# Patient Record
Sex: Male | Born: 2005 | Race: White | Hispanic: No | Marital: Single | Smoking: Never smoker
Health system: Southern US, Community
[De-identification: ages and names within clinical notes are randomized; demographics above are authoritative.]

## PROBLEM LIST (undated history)

## (undated) DIAGNOSIS — R569 Unspecified convulsions: Secondary | ICD-10-CM

## (undated) HISTORY — DX: Unspecified convulsions: R56.9

## (undated) HISTORY — PX: CIRCUMCISION: SHX1350

---
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
## 2023-01-20 ENCOUNTER — Other Ambulatory Visit (HOSPITAL_BASED_OUTPATIENT_CLINIC_OR_DEPARTMENT_OTHER): Payer: Self-pay | Admitting: Pulmonary Disease

## 2023-04-10 DIAGNOSIS — M6281 Muscle weakness (generalized): Secondary | ICD-10-CM | POA: Diagnosis not present

## 2023-04-10 DIAGNOSIS — M25612 Stiffness of left shoulder, not elsewhere classified: Secondary | ICD-10-CM | POA: Diagnosis not present

## 2023-04-10 DIAGNOSIS — M25512 Pain in left shoulder: Secondary | ICD-10-CM | POA: Diagnosis not present

## 2023-04-13 DIAGNOSIS — G40309 Generalized idiopathic epilepsy and epileptic syndromes, not intractable, without status epilepticus: Secondary | ICD-10-CM | POA: Diagnosis not present

## 2023-04-16 DIAGNOSIS — M6281 Muscle weakness (generalized): Secondary | ICD-10-CM | POA: Diagnosis not present

## 2023-04-16 DIAGNOSIS — M25512 Pain in left shoulder: Secondary | ICD-10-CM | POA: Diagnosis not present

## 2023-04-16 DIAGNOSIS — M25612 Stiffness of left shoulder, not elsewhere classified: Secondary | ICD-10-CM | POA: Diagnosis not present

## 2023-04-16 DIAGNOSIS — R9401 Abnormal electroencephalogram [EEG]: Secondary | ICD-10-CM | POA: Diagnosis not present

## 2023-04-16 DIAGNOSIS — G40309 Generalized idiopathic epilepsy and epileptic syndromes, not intractable, without status epilepticus: Secondary | ICD-10-CM | POA: Diagnosis not present

## 2023-04-22 DIAGNOSIS — M25512 Pain in left shoulder: Secondary | ICD-10-CM | POA: Diagnosis not present

## 2023-04-22 DIAGNOSIS — Z4789 Encounter for other orthopedic aftercare: Secondary | ICD-10-CM | POA: Diagnosis not present

## 2023-04-25 DIAGNOSIS — M25512 Pain in left shoulder: Secondary | ICD-10-CM | POA: Diagnosis not present

## 2023-04-25 DIAGNOSIS — Z4789 Encounter for other orthopedic aftercare: Secondary | ICD-10-CM | POA: Diagnosis not present

## 2023-04-29 DIAGNOSIS — Z4789 Encounter for other orthopedic aftercare: Secondary | ICD-10-CM | POA: Diagnosis not present

## 2023-04-29 DIAGNOSIS — M25512 Pain in left shoulder: Secondary | ICD-10-CM | POA: Diagnosis not present

## 2023-05-01 DIAGNOSIS — Z4789 Encounter for other orthopedic aftercare: Secondary | ICD-10-CM | POA: Diagnosis not present

## 2023-05-01 DIAGNOSIS — M25512 Pain in left shoulder: Secondary | ICD-10-CM | POA: Diagnosis not present

## 2023-05-09 DIAGNOSIS — R9401 Abnormal electroencephalogram [EEG]: Secondary | ICD-10-CM | POA: Diagnosis not present

## 2023-05-09 DIAGNOSIS — F909 Attention-deficit hyperactivity disorder, unspecified type: Secondary | ICD-10-CM | POA: Diagnosis not present

## 2023-05-09 DIAGNOSIS — G40409 Other generalized epilepsy and epileptic syndromes, not intractable, without status epilepticus: Secondary | ICD-10-CM | POA: Diagnosis not present

## 2023-05-09 DIAGNOSIS — G40309 Generalized idiopathic epilepsy and epileptic syndromes, not intractable, without status epilepticus: Secondary | ICD-10-CM | POA: Diagnosis not present

## 2023-05-11 DIAGNOSIS — G40309 Generalized idiopathic epilepsy and epileptic syndromes, not intractable, without status epilepticus: Secondary | ICD-10-CM | POA: Diagnosis not present

## 2023-05-11 DIAGNOSIS — R9401 Abnormal electroencephalogram [EEG]: Secondary | ICD-10-CM | POA: Diagnosis not present

## 2023-05-12 DIAGNOSIS — I498 Other specified cardiac arrhythmias: Secondary | ICD-10-CM | POA: Diagnosis not present

## 2023-05-13 DIAGNOSIS — Z4789 Encounter for other orthopedic aftercare: Secondary | ICD-10-CM | POA: Diagnosis not present

## 2023-05-13 DIAGNOSIS — Z9889 Other specified postprocedural states: Secondary | ICD-10-CM | POA: Diagnosis not present

## 2023-05-13 DIAGNOSIS — M25512 Pain in left shoulder: Secondary | ICD-10-CM | POA: Diagnosis not present

## 2023-05-15 DIAGNOSIS — Z9889 Other specified postprocedural states: Secondary | ICD-10-CM | POA: Diagnosis not present

## 2023-05-15 DIAGNOSIS — Z4789 Encounter for other orthopedic aftercare: Secondary | ICD-10-CM | POA: Diagnosis not present

## 2023-05-15 DIAGNOSIS — M25512 Pain in left shoulder: Secondary | ICD-10-CM | POA: Diagnosis not present

## 2023-05-22 DIAGNOSIS — M25512 Pain in left shoulder: Secondary | ICD-10-CM | POA: Diagnosis not present

## 2023-05-27 DIAGNOSIS — Z9889 Other specified postprocedural states: Secondary | ICD-10-CM | POA: Diagnosis not present

## 2023-05-27 DIAGNOSIS — Z4789 Encounter for other orthopedic aftercare: Secondary | ICD-10-CM | POA: Diagnosis not present

## 2023-05-27 DIAGNOSIS — M25512 Pain in left shoulder: Secondary | ICD-10-CM | POA: Diagnosis not present

## 2023-06-05 DIAGNOSIS — M25512 Pain in left shoulder: Secondary | ICD-10-CM | POA: Diagnosis not present

## 2023-06-05 DIAGNOSIS — Z9889 Other specified postprocedural states: Secondary | ICD-10-CM | POA: Diagnosis not present

## 2023-06-05 DIAGNOSIS — Z4789 Encounter for other orthopedic aftercare: Secondary | ICD-10-CM | POA: Diagnosis not present

## 2023-06-10 DIAGNOSIS — M25512 Pain in left shoulder: Secondary | ICD-10-CM | POA: Diagnosis not present

## 2023-06-10 DIAGNOSIS — Z4789 Encounter for other orthopedic aftercare: Secondary | ICD-10-CM | POA: Diagnosis not present

## 2023-06-10 DIAGNOSIS — Z9889 Other specified postprocedural states: Secondary | ICD-10-CM | POA: Diagnosis not present

## 2023-06-19 DIAGNOSIS — Z9889 Other specified postprocedural states: Secondary | ICD-10-CM | POA: Diagnosis not present

## 2023-06-19 DIAGNOSIS — Z4789 Encounter for other orthopedic aftercare: Secondary | ICD-10-CM | POA: Diagnosis not present

## 2023-06-19 DIAGNOSIS — M25512 Pain in left shoulder: Secondary | ICD-10-CM | POA: Diagnosis not present

## 2023-06-24 DIAGNOSIS — M25512 Pain in left shoulder: Secondary | ICD-10-CM | POA: Diagnosis not present

## 2023-06-24 DIAGNOSIS — Z9889 Other specified postprocedural states: Secondary | ICD-10-CM | POA: Diagnosis not present

## 2023-06-24 DIAGNOSIS — Z4789 Encounter for other orthopedic aftercare: Secondary | ICD-10-CM | POA: Diagnosis not present

## 2023-06-26 DIAGNOSIS — Z9889 Other specified postprocedural states: Secondary | ICD-10-CM | POA: Diagnosis not present

## 2023-06-26 DIAGNOSIS — M25512 Pain in left shoulder: Secondary | ICD-10-CM | POA: Diagnosis not present

## 2023-06-26 DIAGNOSIS — Z4789 Encounter for other orthopedic aftercare: Secondary | ICD-10-CM | POA: Diagnosis not present

## 2023-07-03 DIAGNOSIS — H93291 Other abnormal auditory perceptions, right ear: Secondary | ICD-10-CM | POA: Diagnosis not present

## 2023-07-03 DIAGNOSIS — H6981 Other specified disorders of Eustachian tube, right ear: Secondary | ICD-10-CM | POA: Diagnosis not present

## 2023-07-08 DIAGNOSIS — Z9889 Other specified postprocedural states: Secondary | ICD-10-CM | POA: Diagnosis not present

## 2023-07-08 DIAGNOSIS — M25512 Pain in left shoulder: Secondary | ICD-10-CM | POA: Diagnosis not present

## 2023-07-08 DIAGNOSIS — Z4789 Encounter for other orthopedic aftercare: Secondary | ICD-10-CM | POA: Diagnosis not present

## 2023-07-10 DIAGNOSIS — M25512 Pain in left shoulder: Secondary | ICD-10-CM | POA: Diagnosis not present

## 2023-07-10 DIAGNOSIS — Z9889 Other specified postprocedural states: Secondary | ICD-10-CM | POA: Diagnosis not present

## 2023-07-10 DIAGNOSIS — Z4789 Encounter for other orthopedic aftercare: Secondary | ICD-10-CM | POA: Diagnosis not present

## 2023-07-15 DIAGNOSIS — M25512 Pain in left shoulder: Secondary | ICD-10-CM | POA: Diagnosis not present

## 2023-07-15 DIAGNOSIS — Z4789 Encounter for other orthopedic aftercare: Secondary | ICD-10-CM | POA: Diagnosis not present

## 2023-07-15 DIAGNOSIS — Z9889 Other specified postprocedural states: Secondary | ICD-10-CM | POA: Diagnosis not present

## 2023-07-24 DIAGNOSIS — Z9889 Other specified postprocedural states: Secondary | ICD-10-CM | POA: Diagnosis not present

## 2023-07-24 DIAGNOSIS — Z4789 Encounter for other orthopedic aftercare: Secondary | ICD-10-CM | POA: Diagnosis not present

## 2023-07-24 DIAGNOSIS — M25512 Pain in left shoulder: Secondary | ICD-10-CM | POA: Diagnosis not present

## 2023-08-07 DIAGNOSIS — Z4789 Encounter for other orthopedic aftercare: Secondary | ICD-10-CM | POA: Diagnosis not present

## 2023-08-07 DIAGNOSIS — M25512 Pain in left shoulder: Secondary | ICD-10-CM | POA: Diagnosis not present

## 2023-08-07 DIAGNOSIS — Z9889 Other specified postprocedural states: Secondary | ICD-10-CM | POA: Diagnosis not present

## 2023-09-02 DIAGNOSIS — Z4789 Encounter for other orthopedic aftercare: Secondary | ICD-10-CM | POA: Diagnosis not present

## 2023-09-12 DIAGNOSIS — G40309 Generalized idiopathic epilepsy and epileptic syndromes, not intractable, without status epilepticus: Secondary | ICD-10-CM | POA: Diagnosis not present

## 2023-09-12 DIAGNOSIS — R9401 Abnormal electroencephalogram [EEG]: Secondary | ICD-10-CM | POA: Diagnosis not present

## 2023-09-22 DIAGNOSIS — B078 Other viral warts: Secondary | ICD-10-CM | POA: Diagnosis not present

## 2023-10-27 DIAGNOSIS — S0990XA Unspecified injury of head, initial encounter: Secondary | ICD-10-CM | POA: Diagnosis not present

## 2023-10-27 DIAGNOSIS — R519 Headache, unspecified: Secondary | ICD-10-CM | POA: Diagnosis not present

## 2023-10-31 DIAGNOSIS — J4520 Mild intermittent asthma, uncomplicated: Secondary | ICD-10-CM | POA: Diagnosis not present

## 2023-10-31 DIAGNOSIS — J3081 Allergic rhinitis due to animal (cat) (dog) hair and dander: Secondary | ICD-10-CM | POA: Diagnosis not present

## 2023-10-31 DIAGNOSIS — J3089 Other allergic rhinitis: Secondary | ICD-10-CM | POA: Diagnosis not present

## 2023-10-31 DIAGNOSIS — J301 Allergic rhinitis due to pollen: Secondary | ICD-10-CM | POA: Diagnosis not present

## 2023-11-19 DIAGNOSIS — J3089 Other allergic rhinitis: Secondary | ICD-10-CM | POA: Diagnosis not present

## 2023-11-20 DIAGNOSIS — J3081 Allergic rhinitis due to animal (cat) (dog) hair and dander: Secondary | ICD-10-CM | POA: Diagnosis not present

## 2023-11-20 DIAGNOSIS — J301 Allergic rhinitis due to pollen: Secondary | ICD-10-CM | POA: Diagnosis not present

## 2023-11-28 DIAGNOSIS — D649 Anemia, unspecified: Secondary | ICD-10-CM | POA: Diagnosis not present

## 2023-11-28 DIAGNOSIS — Z0000 Encounter for general adult medical examination without abnormal findings: Secondary | ICD-10-CM | POA: Diagnosis not present

## 2023-11-28 DIAGNOSIS — J4530 Mild persistent asthma, uncomplicated: Secondary | ICD-10-CM | POA: Diagnosis not present

## 2023-12-23 ENCOUNTER — Encounter: Payer: Self-pay | Admitting: Neurology

## 2024-03-22 DIAGNOSIS — G40309 Generalized idiopathic epilepsy and epileptic syndromes, not intractable, without status epilepticus: Secondary | ICD-10-CM | POA: Diagnosis not present

## 2024-03-23 DIAGNOSIS — G40109 Localization-related (focal) (partial) symptomatic epilepsy and epileptic syndromes with simple partial seizures, not intractable, without status epilepticus: Secondary | ICD-10-CM | POA: Diagnosis not present

## 2024-03-23 DIAGNOSIS — Z79899 Other long term (current) drug therapy: Secondary | ICD-10-CM | POA: Diagnosis not present

## 2024-03-29 DIAGNOSIS — G40909 Epilepsy, unspecified, not intractable, without status epilepticus: Secondary | ICD-10-CM | POA: Diagnosis not present

## 2024-04-14 DIAGNOSIS — G40909 Epilepsy, unspecified, not intractable, without status epilepticus: Secondary | ICD-10-CM | POA: Diagnosis not present

## 2024-04-21 DIAGNOSIS — G9389 Other specified disorders of brain: Secondary | ICD-10-CM | POA: Diagnosis not present

## 2024-04-21 DIAGNOSIS — R9401 Abnormal electroencephalogram [EEG]: Secondary | ICD-10-CM | POA: Diagnosis not present

## 2024-04-21 DIAGNOSIS — G40909 Epilepsy, unspecified, not intractable, without status epilepticus: Secondary | ICD-10-CM | POA: Diagnosis not present

## 2024-05-20 DIAGNOSIS — Z79899 Other long term (current) drug therapy: Secondary | ICD-10-CM | POA: Diagnosis not present

## 2024-05-20 DIAGNOSIS — G40909 Epilepsy, unspecified, not intractable, without status epilepticus: Secondary | ICD-10-CM | POA: Diagnosis not present

## 2024-07-28 DIAGNOSIS — G40909 Epilepsy, unspecified, not intractable, without status epilepticus: Secondary | ICD-10-CM | POA: Diagnosis not present

## 2024-07-28 DIAGNOSIS — R9401 Abnormal electroencephalogram [EEG]: Secondary | ICD-10-CM | POA: Diagnosis not present

## 2024-08-31 DIAGNOSIS — G40909 Epilepsy, unspecified, not intractable, without status epilepticus: Secondary | ICD-10-CM | POA: Diagnosis not present

## 2024-08-31 DIAGNOSIS — Z09 Encounter for follow-up examination after completed treatment for conditions other than malignant neoplasm: Secondary | ICD-10-CM | POA: Diagnosis not present

## 2024-08-31 DIAGNOSIS — Z136 Encounter for screening for cardiovascular disorders: Secondary | ICD-10-CM | POA: Diagnosis not present

## 2024-09-01 DIAGNOSIS — Z136 Encounter for screening for cardiovascular disorders: Secondary | ICD-10-CM | POA: Diagnosis not present
